# Patient Record
Sex: Male | Born: 1960
Health system: Southern US, Community
[De-identification: ages and names within clinical notes are randomized; demographics above are authoritative.]

## PROBLEM LIST (undated history)

## (undated) DIAGNOSIS — K219 Gastro-esophageal reflux disease without esophagitis: Secondary | ICD-10-CM

## (undated) HISTORY — PX: HERNIA REPAIR: SHX51

---

## 2001-08-17 ENCOUNTER — Encounter: Payer: Self-pay | Admitting: Emergency Medicine

## 2001-08-17 ENCOUNTER — Emergency Department (HOSPITAL_COMMUNITY): Admission: EM | Admit: 2001-08-17 | Discharge: 2001-08-17 | Payer: Self-pay | Admitting: Emergency Medicine

## 2002-04-30 ENCOUNTER — Encounter: Payer: Self-pay | Admitting: Emergency Medicine

## 2002-04-30 ENCOUNTER — Emergency Department (HOSPITAL_COMMUNITY): Admission: EM | Admit: 2002-04-30 | Discharge: 2002-04-30 | Payer: Self-pay | Admitting: Emergency Medicine

## 2003-07-13 ENCOUNTER — Ambulatory Visit (HOSPITAL_COMMUNITY): Admission: RE | Admit: 2003-07-13 | Discharge: 2003-07-13 | Payer: Self-pay | Admitting: Emergency Medicine

## 2004-07-03 ENCOUNTER — Emergency Department (HOSPITAL_COMMUNITY): Admission: EM | Admit: 2004-07-03 | Discharge: 2004-07-03 | Payer: Self-pay | Admitting: Emergency Medicine

## 2010-02-23 ENCOUNTER — Encounter
Admission: RE | Admit: 2010-02-23 | Discharge: 2010-03-01 | Payer: Self-pay | Source: Home / Self Care | Attending: Family Medicine | Admitting: Family Medicine

## 2010-03-07 ENCOUNTER — Ambulatory Visit: Payer: 59 | Admitting: Physical Therapy

## 2015-03-11 DIAGNOSIS — H5213 Myopia, bilateral: Secondary | ICD-10-CM | POA: Diagnosis not present

## 2016-03-10 DIAGNOSIS — H5213 Myopia, bilateral: Secondary | ICD-10-CM | POA: Diagnosis not present

## 2017-03-10 DIAGNOSIS — H5213 Myopia, bilateral: Secondary | ICD-10-CM | POA: Diagnosis not present

## 2017-06-26 DIAGNOSIS — S61214A Laceration without foreign body of right ring finger without damage to nail, initial encounter: Secondary | ICD-10-CM | POA: Diagnosis not present

## 2017-06-26 MED FILL — SULFAMETHOXAZOLE-TMP DS TAB: 800-160 | 10 days supply | Qty: 20 | Fill #0

## 2018-03-07 DIAGNOSIS — H5213 Myopia, bilateral: Secondary | ICD-10-CM | POA: Diagnosis not present

## 2019-04-11 ENCOUNTER — Ambulatory Visit (INDEPENDENT_AMBULATORY_CARE_PROVIDER_SITE_OTHER): Payer: 59 | Admitting: Orthopaedic Surgery

## 2019-04-11 ENCOUNTER — Ambulatory Visit (INDEPENDENT_AMBULATORY_CARE_PROVIDER_SITE_OTHER): Payer: 59

## 2019-04-11 ENCOUNTER — Other Ambulatory Visit: Payer: Self-pay

## 2019-04-11 VITALS — Ht 70.0 in | Wt 235.0 lb

## 2019-04-11 DIAGNOSIS — M25562 Pain in left knee: Secondary | ICD-10-CM

## 2019-04-11 DIAGNOSIS — G8929 Other chronic pain: Secondary | ICD-10-CM | POA: Diagnosis not present

## 2019-04-11 MED ORDER — LIDOCAINE HCL 1 % IJ SOLN
2.0000 mL | INTRAMUSCULAR | Status: AC | PRN
  Administered 2019-04-11: 2 mL

## 2019-04-11 MED ORDER — METHYLPREDNISOLONE ACETATE 40 MG/ML IJ SUSP
40.0000 mg | INTRAMUSCULAR | Status: AC | PRN
  Administered 2019-04-11: 40 mg via INTRA_ARTICULAR

## 2019-04-11 MED ORDER — BUPIVACAINE HCL 0.5 % IJ SOLN
2.0000 mL | INTRAMUSCULAR | Status: AC | PRN
  Administered 2019-04-11: 2 mL via INTRA_ARTICULAR

## 2019-04-11 NOTE — Progress Notes (Signed)
Office Visit Note   Patient: James George           Date of Birth: 03-27-1960           MRN: [id number] Visit Date: 04/11/2019              Requested by: No referring provider defined for this encounter. PCP: [facility]   Assessment & Plan: Visit Diagnoses:  1. Chronic pain of left knee     Plan: Impression is acute left medial meniscal tear with effusion.  He does not have any mechanical symptoms.  We agreed to proceed with aspiration and cortisone injection today which he tolerated well.  Patient instructed to follow-up with [place] if he does not receive any pain relief from this.  Neck step would be to obtain MRI.  Follow-Up Instructions: Return if symptoms worsen or fail to improve.   Orders:  Orders Placed This Encounter  Procedures  . XR KNEE 3 VIEW LEFT   No orders of the defined types were placed in this encounter.     Procedures: Large Joint Inj: L knee on 04/11/2019 10:54 AM Details: 22 G needle Medications: 2 mL bupivacaine 0.5 %; 2 mL lidocaine 1 %; 40 mg methylPREDNISolone acetate 40 MG/ML Aspirate: 10 mL clear Outcome: tolerated well, no immediate complications Patient was prepped and draped in the usual sterile fashion.       Clinical Data: No additional findings.   Subjective: Chief Complaint  Patient presents with  . Left Knee - Pain    James George is a very pleasant 59 year old gentleman who works as a Engineer, civil (consulting) in the [employer].  He has had chronic left knee pain for a year that has severely gotten worse in the last 2 weeks.  It hurts constantly on the medial side.  Denies any injuries.  Denies any mechanical symptoms.  He does feel some grinding occasionally.  He has pain with terminal flexion.   Review of Systems  Constitutional: Negative.   All other systems reviewed and are negative.    Objective: Vital Signs: Ht 5\' 10"  (1.778 m)   Wt 235 lb (106.6 kg)   BMI 33.72 kg/m   Physical Exam Vitals and nursing note  reviewed.  Constitutional:      Appearance: He is well-developed.  HENT:     Head: Normocephalic and atraumatic.  Eyes:     Pupils: Pupils are equal, round, and reactive to light.  Pulmonary:     Effort: Pulmonary effort is normal.  Abdominal:     Palpations: Abdomen is soft.  Musculoskeletal:        General: Normal range of motion.     Cervical back: Neck supple.  Skin:    General: Skin is warm.  Neurological:     Mental Status: He is alert and oriented to person, place, and time.  Psychiatric:        Behavior: Behavior normal.        Thought Content: Thought content normal.        Judgment: Judgment normal.     Ortho Exam Left knee exam shows a small joint effusion.  Medial joint line tenderness.  Positive McMurray.  Moderate pain with deep flexion.  Range of motion is slightly restricted secondary to the effusion and pain. Specialty Comments:  No specialty comments available.  Imaging: XR KNEE 3 VIEW LEFT  Result Date: 04/11/2019 No acute or structural abnormalities    PMFS History: There are no problems  to display for this patient.  No past medical history on file.  No family history on file.   Social History   Occupational History  . Not on file  Tobacco Use  . Smoking status: Not on file  Substance and Sexual Activity  . Alcohol use: Not on file  . Drug use: Not on file  . Sexual activity: Not on file

## 2019-04-21 ENCOUNTER — Encounter: Payer: Self-pay | Admitting: Orthopaedic Surgery

## 2019-04-21 ENCOUNTER — Other Ambulatory Visit: Payer: Self-pay

## 2019-04-21 DIAGNOSIS — M25562 Pain in left knee: Secondary | ICD-10-CM

## 2019-04-21 DIAGNOSIS — G8929 Other chronic pain: Secondary | ICD-10-CM

## 2019-04-21 NOTE — Telephone Encounter (Signed)
Let's get an MRI of left knee r/o MMT.

## 2019-04-22 ENCOUNTER — Telehealth: Payer: Self-pay | Admitting: *Deleted

## 2019-04-22 NOTE — Telephone Encounter (Signed)
See message below. Okay for note.   

## 2019-04-22 NOTE — Telephone Encounter (Signed)
Pt says he was put of work until Saturday and states he doesn't think he can go back to work until he has his MRI, pt would like to get another work note.

## 2019-04-22 NOTE — Telephone Encounter (Signed)
Yes that's fine 

## 2019-04-24 ENCOUNTER — Telehealth (HOSPITAL_COMMUNITY): Payer: Self-pay | Admitting: Orthopaedic Surgery

## 2019-04-24 ENCOUNTER — Other Ambulatory Visit: Payer: Self-pay | Admitting: Orthopaedic Surgery

## 2019-04-24 DIAGNOSIS — M25562 Pain in left knee: Secondary | ICD-10-CM

## 2019-04-24 DIAGNOSIS — G8929 Other chronic pain: Secondary | ICD-10-CM

## 2019-04-24 NOTE — Telephone Encounter (Signed)
Ready for pick up at the front desk. Called patient. He is aware.

## 2019-04-24 NOTE — Telephone Encounter (Signed)
04/24/19~S/W patient on mobile phone, patient wants to check cost of MRI @ [facility] vs [facility]. Patient was given [facility] and [facility] tel #. MF

## 2019-05-05 ENCOUNTER — Other Ambulatory Visit: Payer: Self-pay

## 2019-05-05 ENCOUNTER — Ambulatory Visit (HOSPITAL_COMMUNITY)
Admission: RE | Admit: 2019-05-05 | Discharge: 2019-05-05 | Disposition: A | Discharge status: Home / Self Care | Payer: 59 | Source: Ambulatory Visit | Attending: Orthopaedic Surgery | Admitting: Orthopaedic Surgery

## 2019-05-05 DIAGNOSIS — G8929 Other chronic pain: Secondary | ICD-10-CM | POA: Diagnosis not present

## 2019-05-05 DIAGNOSIS — M25562 Pain in left knee: Secondary | ICD-10-CM | POA: Diagnosis not present

## 2019-05-06 ENCOUNTER — Telehealth: Payer: Self-pay | Admitting: Orthopaedic Surgery

## 2019-05-06 ENCOUNTER — Encounter: Payer: Self-pay | Admitting: Orthopaedic Surgery

## 2019-05-06 NOTE — Telephone Encounter (Signed)
EMAILED NOTE

## 2019-05-06 NOTE — Telephone Encounter (Signed)
yes

## 2019-05-06 NOTE — Telephone Encounter (Signed)
Note made. See other message.

## 2019-05-06 NOTE — Telephone Encounter (Signed)
Patient called advised he need his out of work note extended until his appointment 05/13/2019 with Dr. Roda Shutters. Patient advised he think that he will be scheduled for surgery and will also need another out of work note at that time.  Patient asked if the note can be emailed to him? E-mail address   [email or web address].  The number to contact patient is [phone]

## 2019-05-06 NOTE — Telephone Encounter (Signed)
IC patient to schedule his MRI Review appointment with Dr. Roda Shutters.  Patient is scheduled for Tuesday, April 13.  Patient stated that he has an out of work note through yesterday.  He is needing an updated note to cover him through next Tuesday when he gets his results.  Please call when note is ready for p/u.  CB#[phone].  Thank you.

## 2019-05-12 ENCOUNTER — Encounter: Payer: Self-pay | Admitting: Orthopaedic Surgery

## 2019-05-12 NOTE — Telephone Encounter (Signed)
Yes that's fine.  Whatever he needs.

## 2019-05-13 ENCOUNTER — Ambulatory Visit (INDEPENDENT_AMBULATORY_CARE_PROVIDER_SITE_OTHER): Payer: 59 | Admitting: Orthopaedic Surgery

## 2019-05-13 ENCOUNTER — Other Ambulatory Visit: Payer: Self-pay

## 2019-05-13 ENCOUNTER — Encounter: Payer: Self-pay | Admitting: Orthopaedic Surgery

## 2019-05-13 DIAGNOSIS — S83282A Other tear of lateral meniscus, current injury, left knee, initial encounter: Secondary | ICD-10-CM | POA: Insufficient documentation

## 2019-05-13 DIAGNOSIS — S83242A Other tear of medial meniscus, current injury, left knee, initial encounter: Secondary | ICD-10-CM | POA: Insufficient documentation

## 2019-05-13 NOTE — Progress Notes (Signed)
   Office Visit Note   Patient: James George           Date of Birth: [birth year]           MRN: [id number] Visit Date: 05/13/2019              Requested by: [facility] [address] [place],  [place] [address] PCP: [facility]   Assessment & Plan: Visit Diagnoses:  1. Acute medial meniscus tear of left knee, initial encounter   2. Acute lateral meniscus tear of left knee, initial encounter     Plan: MRI shows a fairly extensive medial meniscal tear with a possible horizontal tear of the lateral meniscus and a small parameniscal cyst.  He has mild chondromalacia.  These findings were reviewed with the patient in detail and recommendation is for a partial medial meniscectomy and possible partial lateral meniscectomy and decompression of the para meniscal cyst.  Risk benefits rehab recovery reviewed with the patient in detail.  All questions answered.  We look forward to treating James George in the operating room in the near future.  Follow-Up Instructions: Return for 1 week postop visit.   Orders:  No orders of the defined types were placed in this encounter.  No orders of the defined types were placed in this encounter.     Procedures: No procedures performed   Clinical Data: No additional findings.   Subjective: Chief Complaint  Patient presents with  . Left Knee - Pain    James George returns today for MRI review of his left knee.  He continues to have significant pain of the left knee especially on the medial side.  He does use a knee scooter to help with ambulating long distances.   Review of Systems  Constitutional: Negative.   All other systems reviewed and are negative.    Objective: Vital Signs: There were no vitals taken for this visit.  Physical Exam Vitals and nursing note reviewed.  Constitutional:      Appearance: He is well-developed.  Pulmonary:     Effort: Pulmonary effort is normal.    Abdominal:     Palpations: Abdomen is soft.  Skin:    General: Skin is warm.  Neurological:     Mental Status: He is alert and oriented to person, place, and time.  Psychiatric:        Behavior: Behavior normal.        Thought Content: Thought content normal.        Judgment: Judgment normal.     Ortho Exam Left knee exam is unchanged. Specialty Comments:  No specialty comments available.  Imaging: No results found.   PMFS History: Patient Active Problem List   Diagnosis Date Noted  . Acute medial meniscus tear of left knee 05/13/2019  . Acute lateral meniscus tear of left knee 05/13/2019   History reviewed. No pertinent past medical history.  History reviewed. No pertinent family history.  History reviewed. No pertinent surgical history. Social History   Occupational History  . Not on file  Tobacco Use  . Smoking status: Never Smoker  . Smokeless tobacco: Never Used  Substance and Sexual Activity  . Alcohol use: Not on file  . Drug use: Not on file  . Sexual activity: Not on file

## 2019-05-14 NOTE — Telephone Encounter (Signed)
2-6 weeks

## 2019-05-16 NOTE — Telephone Encounter (Signed)
Yes he can be out of work

## 2019-05-19 ENCOUNTER — Other Ambulatory Visit: Payer: Self-pay

## 2019-05-20 ENCOUNTER — Encounter (HOSPITAL_BASED_OUTPATIENT_CLINIC_OR_DEPARTMENT_OTHER): Payer: Self-pay | Admitting: Orthopaedic Surgery

## 2019-05-20 ENCOUNTER — Other Ambulatory Visit: Payer: Self-pay

## 2019-05-24 ENCOUNTER — Other Ambulatory Visit (HOSPITAL_COMMUNITY)
Admission: RE | Admit: 2019-05-24 | Discharge: 2019-05-24 | Disposition: A | Discharge status: Home / Self Care | Payer: 59 | Source: Ambulatory Visit | Attending: Orthopaedic Surgery | Admitting: Orthopaedic Surgery

## 2019-05-24 DIAGNOSIS — Z01812 Encounter for preprocedural laboratory examination: Secondary | ICD-10-CM | POA: Insufficient documentation

## 2019-05-24 DIAGNOSIS — Z20822 Contact with and (suspected) exposure to covid-19: Secondary | ICD-10-CM | POA: Insufficient documentation

## 2019-05-25 LAB — SARS CORONAVIRUS 2 (TAT 6-24 HRS): SARS Coronavirus 2: NEGATIVE

## 2019-05-28 ENCOUNTER — Ambulatory Visit (HOSPITAL_BASED_OUTPATIENT_CLINIC_OR_DEPARTMENT_OTHER): Payer: 59 | Admitting: Anesthesiology

## 2019-05-28 ENCOUNTER — Ambulatory Visit (HOSPITAL_BASED_OUTPATIENT_CLINIC_OR_DEPARTMENT_OTHER)
Admission: RE | Admit: 2019-05-28 | Discharge: 2019-05-28 | Disposition: A | Discharge status: Home / Self Care | Payer: 59 | Attending: Orthopaedic Surgery | Admitting: Orthopaedic Surgery

## 2019-05-28 ENCOUNTER — Other Ambulatory Visit: Payer: Self-pay

## 2019-05-28 ENCOUNTER — Encounter (HOSPITAL_BASED_OUTPATIENT_CLINIC_OR_DEPARTMENT_OTHER): Payer: Self-pay | Admitting: Orthopaedic Surgery

## 2019-05-28 ENCOUNTER — Encounter (HOSPITAL_BASED_OUTPATIENT_CLINIC_OR_DEPARTMENT_OTHER)
Admission: RE | Disposition: A | Discharge status: Home / Self Care | Payer: Self-pay | Source: Home / Self Care | Attending: Orthopaedic Surgery

## 2019-05-28 DIAGNOSIS — X58XXXA Exposure to other specified factors, initial encounter: Secondary | ICD-10-CM | POA: Diagnosis not present

## 2019-05-28 DIAGNOSIS — E669 Obesity, unspecified: Secondary | ICD-10-CM | POA: Diagnosis not present

## 2019-05-28 DIAGNOSIS — M94261 Chondromalacia, right knee: Secondary | ICD-10-CM | POA: Diagnosis not present

## 2019-05-28 DIAGNOSIS — S83231A Complex tear of medial meniscus, current injury, right knee, initial encounter: Secondary | ICD-10-CM | POA: Diagnosis not present

## 2019-05-28 DIAGNOSIS — M2241 Chondromalacia patellae, right knee: Secondary | ICD-10-CM | POA: Insufficient documentation

## 2019-05-28 DIAGNOSIS — S83282A Other tear of lateral meniscus, current injury, left knee, initial encounter: Secondary | ICD-10-CM | POA: Diagnosis present

## 2019-05-28 DIAGNOSIS — S83242A Other tear of medial meniscus, current injury, left knee, initial encounter: Secondary | ICD-10-CM | POA: Diagnosis not present

## 2019-05-28 DIAGNOSIS — K219 Gastro-esophageal reflux disease without esophagitis: Secondary | ICD-10-CM | POA: Insufficient documentation

## 2019-05-28 DIAGNOSIS — Z6833 Body mass index (BMI) 33.0-33.9, adult: Secondary | ICD-10-CM | POA: Insufficient documentation

## 2019-05-28 HISTORY — PX: KNEE ARTHROSCOPY WITH MEDIAL MENISECTOMY: SHX5651

## 2019-05-28 HISTORY — DX: Gastro-esophageal reflux disease without esophagitis: K21.9

## 2019-05-28 SURGERY — ARTHROSCOPY, KNEE, WITH MEDIAL MENISCECTOMY
Anesthesia: General | Site: Knee | Laterality: Left

## 2019-05-28 MED ORDER — LIDOCAINE HCL (CARDIAC) PF 100 MG/5ML IV SOSY
PREFILLED_SYRINGE | INTRAVENOUS | Status: DC | PRN
  Administered 2019-05-28: 40 mg via INTRAVENOUS

## 2019-05-28 MED ORDER — OXYCODONE HCL 5 MG/5ML PO SOLN: 5.0000 mg | Freq: Once | ORAL | Status: DC | PRN

## 2019-05-28 MED ORDER — LACTATED RINGERS IV SOLN: INTRAVENOUS | Status: DC

## 2019-05-28 MED ORDER — MIDAZOLAM HCL 2 MG/2ML IJ SOLN
INTRAMUSCULAR | Status: AC
  Filled 2019-05-28: qty 2

## 2019-05-28 MED ORDER — PROPOFOL 10 MG/ML IV BOLUS
INTRAVENOUS | Status: DC | PRN
  Administered 2019-05-28: 180 mg via INTRAVENOUS

## 2019-05-28 MED ORDER — HYDROCODONE-ACETAMINOPHEN 5-325 MG PO TABS: 1.0000 | ORAL_TABLET | Freq: Three times a day (TID) | ORAL | 0 refills | Status: AC | PRN

## 2019-05-28 MED ORDER — DEXAMETHASONE SODIUM PHOSPHATE 10 MG/ML IJ SOLN
INTRAMUSCULAR | Status: AC
  Filled 2019-05-28: qty 1

## 2019-05-28 MED ORDER — MIDAZOLAM HCL 2 MG/2ML IJ SOLN
INTRAMUSCULAR | Status: DC | PRN
  Administered 2019-05-28: 2 mg via INTRAVENOUS

## 2019-05-28 MED ORDER — FENTANYL CITRATE (PF) 100 MCG/2ML IJ SOLN: 50.0000 ug | INTRAMUSCULAR | Status: DC | PRN

## 2019-05-28 MED ORDER — MEPERIDINE HCL 25 MG/ML IJ SOLN: 6.2500 mg | INTRAMUSCULAR | Status: DC | PRN

## 2019-05-28 MED ORDER — ONDANSETRON HCL 4 MG/2ML IJ SOLN
INTRAMUSCULAR | Status: DC | PRN
  Administered 2019-05-28: 4 mg via INTRAVENOUS

## 2019-05-28 MED ORDER — CEFAZOLIN SODIUM-DEXTROSE 2-4 GM/100ML-% IV SOLN
INTRAVENOUS | Status: AC
  Filled 2019-05-28: qty 100

## 2019-05-28 MED ORDER — FENTANYL CITRATE (PF) 100 MCG/2ML IJ SOLN: 25.0000 ug | INTRAMUSCULAR | Status: DC | PRN

## 2019-05-28 MED ORDER — CEFAZOLIN SODIUM-DEXTROSE 2-4 GM/100ML-% IV SOLN
2.0000 g | INTRAVENOUS | Status: AC
  Administered 2019-05-28: 2 g via INTRAVENOUS

## 2019-05-28 MED ORDER — SODIUM CHLORIDE 0.9 % IR SOLN
Status: DC | PRN
  Administered 2019-05-28: 3000 mL

## 2019-05-28 MED ORDER — ONDANSETRON HCL 4 MG/2ML IJ SOLN: 4.0000 mg | Freq: Once | INTRAMUSCULAR | Status: DC | PRN

## 2019-05-28 MED ORDER — FENTANYL CITRATE (PF) 100 MCG/2ML IJ SOLN
INTRAMUSCULAR | Status: AC
  Filled 2019-05-28: qty 2

## 2019-05-28 MED ORDER — DEXAMETHASONE SODIUM PHOSPHATE 10 MG/ML IJ SOLN
INTRAMUSCULAR | Status: DC | PRN
  Administered 2019-05-28: 5 mg via INTRAVENOUS

## 2019-05-28 MED ORDER — ONDANSETRON HCL 4 MG PO TABS: 4.0000 mg | ORAL_TABLET | Freq: Three times a day (TID) | ORAL | 0 refills | Status: AC | PRN

## 2019-05-28 MED ORDER — ONDANSETRON HCL 4 MG/2ML IJ SOLN
INTRAMUSCULAR | Status: AC
  Filled 2019-05-28: qty 2

## 2019-05-28 MED ORDER — FENTANYL CITRATE (PF) 100 MCG/2ML IJ SOLN
INTRAMUSCULAR | Status: DC | PRN
  Administered 2019-05-28 (×2): 25 ug via INTRAVENOUS
  Administered 2019-05-28: 50 ug via INTRAVENOUS
  Administered 2019-05-28: 25 ug via INTRAVENOUS

## 2019-05-28 MED ORDER — BUPIVACAINE HCL (PF) 0.25 % IJ SOLN
INTRAMUSCULAR | Status: DC | PRN
  Administered 2019-05-28: 20 mL via INTRA_ARTICULAR

## 2019-05-28 MED ORDER — OXYCODONE HCL 5 MG PO TABS: 5.0000 mg | ORAL_TABLET | Freq: Once | ORAL | Status: DC | PRN

## 2019-05-28 MED ORDER — LIDOCAINE 2% (20 MG/ML) 5 ML SYRINGE
INTRAMUSCULAR | Status: AC
  Filled 2019-05-28: qty 5

## 2019-05-28 MED ORDER — MIDAZOLAM HCL 2 MG/2ML IJ SOLN: 1.0000 mg | INTRAMUSCULAR | Status: DC | PRN

## 2019-05-28 MED FILL — ONDANSETRON HCL 4 MG TABS: 4 | 3 days supply | Qty: 20 | Fill #0

## 2019-05-28 MED FILL — HYDROCODON-APAP 5-325: 5-325 | 5 days supply | Qty: 30 | Fill #0

## 2019-05-28 SURGICAL SUPPLY — 38 items
BANDAGE ESMARK 6X9 LF (GAUZE/BANDAGES/DRESSINGS) IMPLANT
BLADE SHAVER TORPEDO 4X13 (MISCELLANEOUS) ×2 IMPLANT
BNDG CMPR 9X6 STRL LF SNTH (GAUZE/BANDAGES/DRESSINGS)
BNDG COHESIVE 6X5 TAN STRL LF (GAUZE/BANDAGES/DRESSINGS) ×2 IMPLANT
BNDG ELASTIC 6X5.8 VLCR STR LF (GAUZE/BANDAGES/DRESSINGS) ×6 IMPLANT
BNDG ESMARK 6X9 LF (GAUZE/BANDAGES/DRESSINGS)
COVER WAND RF STERILE (DRAPES) IMPLANT
CUFF TOURN SGL QUICK 34 (TOURNIQUET CUFF) ×3
CUFF TRNQT CYL 34X4.125X (TOURNIQUET CUFF) ×1 IMPLANT
DRAPE ARTHROSCOPY W/POUCH 90 (DRAPES) ×3 IMPLANT
DRAPE IMP U-DRAPE 54X76 (DRAPES) ×3 IMPLANT
DRAPE U-SHAPE 47X51 STRL (DRAPES) ×3 IMPLANT
DURAPREP 26ML APPLICATOR (WOUND CARE) ×3 IMPLANT
GAUZE SPONGE 4X4 12PLY STRL (GAUZE/BANDAGES/DRESSINGS) ×3 IMPLANT
GAUZE XEROFORM 1X8 LF (GAUZE/BANDAGES/DRESSINGS) ×3 IMPLANT
GLOVE BIO SURGEON STRL SZ7 (GLOVE) ×3 IMPLANT
GLOVE BIOGEL PI IND STRL 7.0 (GLOVE) ×1 IMPLANT
GLOVE BIOGEL PI INDICATOR 7.0 (GLOVE) ×8
GLOVE ECLIPSE 7.0 STRL STRAW (GLOVE) ×1 IMPLANT
GLOVE ECLIPSE 7.5 STRL STRAW (GLOVE) ×2 IMPLANT
GLOVE SKINSENSE NS SZ7.5 (GLOVE) ×2
GLOVE SKINSENSE STRL SZ7.5 (GLOVE) ×1 IMPLANT
GLOVE SURG SS PI 7.0 STRL IVOR (GLOVE) ×2 IMPLANT
GLOVE SURG SYN 7.5  E (GLOVE) ×3
GLOVE SURG SYN 7.5 E (GLOVE) ×1 IMPLANT
GLOVE SURG SYN 7.5 PF PI (GLOVE) ×1 IMPLANT
GOWN STRL REIN XL XLG (GOWN DISPOSABLE) ×3 IMPLANT
GOWN STRL REUS W/ TWL LRG LVL3 (GOWN DISPOSABLE) ×1 IMPLANT
GOWN STRL REUS W/ TWL XL LVL3 (GOWN DISPOSABLE) ×1 IMPLANT
GOWN STRL REUS W/TWL LRG LVL3 (GOWN DISPOSABLE) ×6
GOWN STRL REUS W/TWL XL LVL3 (GOWN DISPOSABLE) ×3
KNEE WRAP E Z 3 GEL PACK (MISCELLANEOUS) ×3 IMPLANT
MANIFOLD NEPTUNE II (INSTRUMENTS) ×3 IMPLANT
PACK ARTHROSCOPY DSU (CUSTOM PROCEDURE TRAY) ×3 IMPLANT
SET BASIN DAY SURGERY F.S. (CUSTOM PROCEDURE TRAY) ×3 IMPLANT
SUT ETHILON 3 0 PS 1 (SUTURE) ×3 IMPLANT
TOWEL GREEN STERILE FF (TOWEL DISPOSABLE) ×3 IMPLANT
TUBING ARTHROSCOPY IRRIG 16FT (MISCELLANEOUS) ×3 IMPLANT

## 2019-05-28 NOTE — Discharge Instructions (Signed)
  Post-operative patient instructions  Knee Arthroscopy   . Ice:  Place intermittent ice or cooler pack over your knee, 30 minutes on and 30 minutes off.  Continue this for the first 72 hours after surgery, then save ice for use after therapy sessions or on more active days.   . Weight:  You may bear weight on your leg as your symptoms allow. . Crutches:  Use crutches (or walker) to assist in walking until told to discontinue by your physical therapist or physician. This will help to reduce pain. . Strengthening:  Perform simple thigh squeezes (isometric quad contractions) and straight leg lifts as you are able (3 sets of 5 to 10 repetitions, 3 times a day).  For the leg lifts, have someone support under your ankle in the beginning until you have increased strength enough to do this on your own.  To help get started on thigh squeezes, place a pillow under your knee and push down on the pillow with back of knee (sometimes easier to do than with your leg fully straight). . Motion:  Perform gentle knee motion as tolerated - this is gentle bending and straightening of the knee. Seated heel slides: you can start by sitting in a chair, remove your brace, and gently slide your heel back on the floor - allowing your knee to bend. Have someone help you straighten your knee (or use your other leg/foot hooked under your ankle.  . Dressing:  Perform 1st dressing change at 2 days postoperative. A moderate amount of blood tinged drainage is to be expected.  So if you bleed through the dressing on the first or second day or if you have fevers, it is fine to change the dressing/check the wounds early and redress wound. Elevate your leg.  If it bleeds through again, or if the incisions are leaking frank blood, please call the office. May change dressing every 1-2 days thereafter to help watch wounds. Can purchase Tegaderm (or 3M Nexcare) water resistant dressings at local pharmacy / [facility]. . Shower:  Light shower is  ok after 2 days.  Please take shower, NO bath. Recover with gauze and ace wrap to help keep wounds protected.   . Pain medication:  A narcotic pain medication has been prescribed.  Take as directed.  Typically you need narcotic pain medication more regularly during the first 3 to 5 days after surgery.  Decrease your use of the medication as the pain improves.  Narcotics can sometimes cause constipation, even after a few doses.  If you have problems with constipation, you can take an over the counter stool softener or light laxative.  If you have persistent problems, please notify your physician's office. . Physical therapy: Additional activity guidelines to be provided by your physician or physical therapist at follow-up visits.  . Driving: Do not recommend driving x 2 weeks post surgical, especially if surgery performed on right side. Should not drive while taking narcotic pain medications. It typically takes at least 2 weeks to restore sufficient neuromuscular function for normal reaction times for driving safety.  . Call [phone] for questions or problems. Evenings you will be forwarded to the hospital operator.  Ask for the orthopaedic physician on call. Please call if you experience:    o Redness, foul smelling, or persistent drainage from the surgical site  o worsening knee pain and swelling not responsive to medication  o any calf pain and or swelling of the lower leg  o temperatures greater than   101.5 F o other questions or concerns   Thank you for allowing [place] to be a part of your care.   Post Anesthesia Home Care Instructions  Activity: Get plenty of rest for the remainder of the day. A responsible individual must stay with you for 24 hours following the procedure.  For the next 24 hours, DO NOT: -Drive a car -Advertising copywriter -Drink alcoholic beverages -Take any medication unless instructed by your physician -Make any legal decisions or sign important papers.  Meals: Start  with liquid foods such as gelatin or soup. Progress to regular foods as tolerated. Avoid greasy, spicy, heavy foods. If nausea and/or vomiting occur, drink only clear liquids until the nausea and/or vomiting subsides. Call your physician if vomiting continues.  Special Instructions/Symptoms: Your throat may feel dry or sore from the anesthesia or the breathing tube placed in your throat during surgery. If this causes discomfort, gargle with warm salt water. The discomfort should disappear within 24 hours.  Call your surgeon if you experience:   1.  Fever over 101.0. 2.  Inability to urinate. 3.  Nausea and/or vomiting. 4.  Extreme swelling or bruising at the surgical site. 5.  Continued bleeding from the incision. 6.  Increased pain, redness or drainage from the incision. 7.  Problems related to your pain medication. 8.  Any problems and/or concerns

## 2019-05-28 NOTE — Op Note (Signed)
   Surgery Date: 05/28/2019  PREOPERATIVE DIAGNOSES:  1. Right knee medial meniscus tear  POSTOPERATIVE DIAGNOSES:  same  PROCEDURES PERFORMED:  1. Right knee arthroscopy with limited synovectomy and diagnostic arthroscopy 2. Right knee arthroscopy with arthroscopic partial medial meniscectomy  SURGEON: N. Glee Arvin, M.D.  ASSIST: Starlyn Skeans [place], [place]; necessary for the timely completion of procedure and due to complexity of procedure.  ANESTHESIA:  general  FLUIDS: Per anesthesia record.   ESTIMATED BLOOD LOSS: minimal  DESCRIPTION OF PROCEDURE: James George is a 59 y.o.-year-old male with right knee medial meniscus tear. Plans are to proceed with partial medial meniscectomy and diagnostic arthroscopy with debridement as indicated. Full discussion held regarding risks benefits alternatives and complications related surgical intervention. Conservative care options reviewed. All questions answered.  The patient was identified in the preoperative holding area and the operative extremity was marked. The patient was brought to the operating room and transferred to operating table in a supine position. Satisfactory general anesthesia was induced by anesthesiology.    Standard anterolateral, anteromedial arthroscopy portals were obtained. The anteromedial portal was obtained with a spinal needle for localization under direct visualization with subsequent diagnostic findings.   Incisions were made for arthroscopic portals.  Diagnostic arthroscopy and limited synovectomy was performed for visualization.  We then position the arthroscope in the medial compartment and placed the knee in valgus.  He had mild grade I chondromalacia of the medial compartment.  I identified the complex tear of the medial meniscus mainly in the posterior horn with some extension into the mid body.  Partial medial meniscectomy was performed using meniscus basket and oscillating shaver back to stable border.   Approximately 70% of the meniscus was remaining.  The cruciates were unremarkable.  The knee was then placed in a figure 4 position for the lateral compartment which was unremarkable.  The lateral meniscus did not demonstrate any tears.  He had no significant chondromalacia of the lateral compartment.  The knee was then brought into full extension.  Grade I-II chondromalacia of the femoral trochlea and patellar surface.  Gutters were checked for loose bodies.  Excess fluid was removed from the knee joint.  Incisions were closed with interrupted nylon sutures.  Sterile dressings were applied.  Patient tolerated procedure well had no immediate complications.  Suprapatellar pouch and gutters: mild synovitis or debris. Patella chondral surface: Grade 2 Trochlear chondral surface: Grade 1 Patellofemoral tracking: normal Medial meniscus: delaminating tear of posterior horn.  Medial femoral condyle weight bearing surface: Grade 1 Medial tibial plateau: Grade 1 Anterior cruciate ligament:stable Posterior cruciate ligament:stable Lateral meniscus: normal.   Lateral femoral condyle weight bearing surface: Grade 0 Lateral tibial plateau: Grade 0  DISPOSITION: The patient was awakened from general anesthetic, extubated, taken to the recovery room in medically stable condition, no apparent complications. The patient may be weightbearing as tolerated to the operative lower extremity.  Range of motion of right knee as tolerated.  Mayra Reel, MD [facility] 9:23 AM

## 2019-05-28 NOTE — Anesthesia Procedure Notes (Signed)
Procedure Name: LMA Insertion Date/Time: 05/28/2019 8:47 AM Performed by: Yolonda Kida, CRNA Pre-anesthesia Checklist: Patient identified, Emergency Drugs available, Suction available and Patient being monitored Patient Re-evaluated:Patient Re-evaluated prior to induction Oxygen Delivery Method: Circle system utilized Preoxygenation: Pre-oxygenation with 100% oxygen Induction Type: IV induction LMA: LMA inserted LMA Size: 4.0 Number of attempts: 1 Placement Confirmation: positive ETCO2 and breath sounds checked- equal and bilateral Tube secured with: Tape Dental Injury: Teeth and Oropharynx as per pre-operative assessment

## 2019-05-28 NOTE — Transfer of Care (Signed)
Immediate Anesthesia Transfer of Care Note  Patient: James George  Procedure(s) Performed: LEFT KNEE ARTHROSCOPY WITH PARTIAL MEDIAL MENISCECTOMY (Left Knee)  Patient Location: PACU  Anesthesia Type:General  Level of Consciousness: sedated  Airway & Oxygen Therapy: Patient Spontanous Breathing and Patient connected to face mask oxygen  Post-op Assessment: Report given to RN and Post -op Vital signs reviewed and stable  Post vital signs: Reviewed and stable  Last Vitals:  Vitals Value Taken Time  BP    Temp    Pulse 62 05/28/19 0935  Resp 11 05/28/19 0935  SpO2 100 % 05/28/19 0935  Vitals shown include unvalidated device data.  Last Pain:  Vitals:   05/28/19 0714  TempSrc: Oral  PainSc: 4       Patients Stated Pain Goal: 4 (05/28/19 0714)  Complications: No apparent anesthesia complications

## 2019-05-28 NOTE — H&P (Signed)
    PREOPERATIVE H&P  Chief Complaint: left knee medial meniscal tear, possible lateral meniscal tear  HPI: James George is a 59 y.o. male who presents for surgical treatment of left knee medial meniscal tear, possible lateral meniscal tear.  He denies any changes in medical history.  Past Medical History:  Diagnosis Date  . GERD (gastroesophageal reflux disease)    Past Surgical History:  Procedure Laterality Date  . HERNIA REPAIR     age 45   Social History   Socioeconomic History  . Marital status: Married    Spouse name: Not on file  . Number of children: Not on file  . Years of education: Not on file  . Highest education level: Not on file  Occupational History  . Not on file  Tobacco Use  . Smoking status: Never Smoker  . Smokeless tobacco: Never Used  Substance and Sexual Activity  . Alcohol use: Never  . Drug use: Never  . Sexual activity: Not on file  Other Topics Concern  . Not on file  Social History Narrative  . Not on file   Social Determinants of Health   Financial Resource Strain:   . Difficulty of Paying Living Expenses:   Food Insecurity:   . Worried About Programme researcher, broadcasting/film/video in the Last Year:   . Barista in the Last Year:   Transportation Needs:   . Freight forwarder (Medical):   Marland Kitchen Lack of Transportation (Non-Medical):   Physical Activity:   . Days of Exercise per Week:   . Minutes of Exercise per Session:   Stress:   . Feeling of Stress :   Social Connections:   . Frequency of Communication with Friends and Family:   . Frequency of Social Gatherings with Friends and Family:   . Attends Religious Services:   . Active Member of Clubs or Organizations:   . Attends Banker Meetings:   Marland Kitchen Marital Status:    History reviewed. No pertinent family history. No Known Allergies Prior to Admission medications   Not on File     Positive ROS: All other systems have been reviewed and were otherwise negative with the  exception of those mentioned in the HPI and as above.  Physical Exam: General: Alert, no acute distress Cardiovascular: No pedal edema Respiratory: No cyanosis, no use of accessory musculature GI: abdomen soft Skin: No lesions in the area of chief complaint Neurologic: Sensation intact distally Psychiatric: Patient is competent for consent with normal mood and affect Lymphatic: no lymphedema  MUSCULOSKELETAL: exam stable  Assessment: left knee medial meniscal tear, possible lateral meniscal tear  Plan: Plan for Procedure(s): LEFT KNEE ARTHROSCOPY WITH PARTIAL MEDIAL MENISCECTOMY, POSSIBLE PARTIAL LATERAL MENISCECTOMY  The risks benefits and alternatives were discussed with the patient including but not limited to the risks of nonoperative treatment, versus surgical intervention including infection, bleeding, nerve injury,  blood clots, cardiopulmonary complications, morbidity, mortality, among others, and they were willing to proceed.   Glee Arvin, MD   05/28/2019 7:24 AM

## 2019-05-28 NOTE — Anesthesia Preprocedure Evaluation (Addendum)
Anesthesia Evaluation  Patient identified by MRN, date of birth, ID band Patient awake    Reviewed: Allergy & Precautions, NPO status , Patient's Chart, lab work & pertinent test results  Airway Mallampati: II  TM Distance: >3 FB Neck ROM: Full    Dental no notable dental hx. (+) Teeth Intact, Caps   Pulmonary neg pulmonary ROS,    Pulmonary exam normal breath sounds clear to auscultation       Cardiovascular Normal cardiovascular exam Rhythm:Regular Rate:Normal     Neuro/Psych negative neurological ROS  negative psych ROS   GI/Hepatic Neg liver ROS, GERD  Medicated and Controlled,  Endo/Other  Obesity  Renal/GU negative Renal ROS  negative genitourinary   Musculoskeletal TMM left knee   Abdominal (+) + obese,   Peds  Hematology negative hematology ROS (+)   Anesthesia Other Findings   Reproductive/Obstetrics                             Anesthesia Physical Anesthesia Plan  ASA: II  Anesthesia Plan: General   Post-op Pain Management:    Induction: Intravenous  PONV Risk Score and Plan: 3 and Midazolam, Ondansetron, Dexamethasone and Treatment may vary due to age or medical condition  Airway Management Planned: LMA  Additional Equipment:   Intra-op Plan:   Post-operative Plan: Extubation in OR  Informed Consent: I have reviewed the patients History and Physical, chart, labs and discussed the procedure including the risks, benefits and alternatives for the proposed anesthesia with the patient or authorized representative who has indicated his/her understanding and acceptance.     Dental advisory given  Plan Discussed with: CRNA and Surgeon  Anesthesia Plan Comments:         Anesthesia Quick Evaluation

## 2019-05-28 NOTE — Anesthesia Postprocedure Evaluation (Signed)
Anesthesia Post Note  Patient: James George  Procedure(s) Performed: LEFT KNEE ARTHROSCOPY WITH PARTIAL MEDIAL MENISCECTOMY (Left Knee)     Patient location during evaluation: PACU Anesthesia Type: General Level of consciousness: awake and alert and oriented Pain management: pain level controlled Vital Signs Assessment: post-procedure vital signs reviewed and stable Respiratory status: spontaneous breathing, nonlabored ventilation and respiratory function stable Cardiovascular status: blood pressure returned to baseline and stable Postop Assessment: no apparent nausea or vomiting Anesthetic complications: no    Last Vitals:  Vitals:   05/28/19 1000 05/28/19 1025  BP: 137/84 135/83  Pulse: 63 62  Resp: 13 16  Temp:  36.9 C  SpO2: 98% 97%    Last Pain:  Vitals:   05/28/19 1025  TempSrc:   PainSc: 5                  Ardella Chhim A.

## 2019-05-29 ENCOUNTER — Encounter: Payer: Self-pay | Admitting: *Deleted

## 2019-06-04 ENCOUNTER — Other Ambulatory Visit: Payer: Self-pay

## 2019-06-04 ENCOUNTER — Encounter: Payer: Self-pay | Admitting: Orthopaedic Surgery

## 2019-06-04 ENCOUNTER — Ambulatory Visit (INDEPENDENT_AMBULATORY_CARE_PROVIDER_SITE_OTHER): Payer: 59 | Admitting: Physician Assistant

## 2019-06-04 DIAGNOSIS — Z9889 Other specified postprocedural states: Secondary | ICD-10-CM

## 2019-06-04 NOTE — Progress Notes (Signed)
   Post-Op Visit Note   Patient: James George           Date of Birth: 01/23/1961           MRN: [id number] Visit Date: 06/04/2019 PCP: [facility]   Assessment & Plan:  Chief Complaint:  Chief Complaint  Patient presents with  . Left Knee - Pain   Visit Diagnoses:  1. 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  He has been doing well.  No fevers or chills.  He is ambulating without assistance.  He has been taking naproxen for pain.  Examination of his left knee reveals a trace effusion.  Range of motion from 0 to 90 degrees.  He does have well-healing surgical portals without evidence of infection or cellulitis.  Nylon sutures are in place.  Calves are soft and nontender.  He is neurovascular intact distally.  Today, nylon sutures were removed and Steri-Strips applied.  He will advance with activity as tolerated.  He currently has a work note to last him for approximately 6 weeks postop, but he will call us if he needs this addended.  I have told him that we are happy to write him to return to work half days or whatever suits him best if needed.  He will follow-up with [place] in 5 weeks time for recheck, however if he is doing very well he can call and let us know and cancel his appointment.  Call with concerns or questions in the meantime.  Follow-Up Instructions: Return in about 5 weeks (around 07/09/2019).   Orders:  No orders of the defined types were placed in this encounter.  No orders of the defined types were placed in this encounter.   Imaging: No new imaging  PMFS History: Patient Active Problem List   Diagnosis Date Noted  . Acute medial meniscus tear of left knee 05/13/2019  . Acute lateral meniscus tear of left knee 05/13/2019   Past Medical History:  Diagnosis Date  . GERD  (gastroesophageal reflux disease)     History reviewed. No pertinent family history.  Past Surgical History:  Procedure Laterality Date  . HERNIA REPAIR     age 52  . KNEE ARTHROSCOPY WITH MEDIAL MENISECTOMY Left 05/28/2019   Procedure: LEFT KNEE ARTHROSCOPY WITH PARTIAL MEDIAL MENISCECTOMY;  Surgeon: Tarry Kos, MD;  Location: [facility];  Service: Orthopedics;  Laterality: Left;   Social History   Occupational History  . Not on file  Tobacco Use  . Smoking status: Never Smoker  . Smokeless tobacco: Never Used  Substance and Sexual Activity  . Alcohol use: Never  . Drug use: Never  . Sexual activity: Not on file

## 2019-07-09 ENCOUNTER — Ambulatory Visit (INDEPENDENT_AMBULATORY_CARE_PROVIDER_SITE_OTHER): Payer: 59 | Admitting: Orthopaedic Surgery

## 2019-07-09 ENCOUNTER — Other Ambulatory Visit: Payer: Self-pay

## 2019-07-09 ENCOUNTER — Encounter: Payer: Self-pay | Admitting: Orthopaedic Surgery

## 2019-07-09 ENCOUNTER — Telehealth: Payer: Self-pay

## 2019-07-09 DIAGNOSIS — S83242A Other tear of medial meniscus, current injury, left knee, initial encounter: Secondary | ICD-10-CM

## 2019-07-09 DIAGNOSIS — Z9889 Other specified postprocedural states: Secondary | ICD-10-CM | POA: Insufficient documentation

## 2019-07-09 NOTE — Progress Notes (Signed)
   Post-Op Visit Note   Patient: James George           Date of Birth: December 02, 1960           MRN: [id number] Visit Date: 07/09/2019 PCP: [facility]   Assessment & Plan:  Chief Complaint:  Chief Complaint  Patient presents with  . Left Knee - Pain, Follow-up, Routine Post Op   Visit Diagnoses:  1. S/P arthroscopy of left knee   2. Acute medial meniscus tear of left knee, initial encounter     Plan: Faaris is 6 weeks status post left knee arthroscopy with partial medial meniscectomy.  He is doing well overall.  Not taking any medications.  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  Surgical scars are fully healed.  No swelling or joint effusion.  Excellent range of motion.  Ambulating normally.  At this point he has recovered from his surgery well return back to work the Saturday.  We can see him back as needed.  Follow-Up Instructions: Return if symptoms worsen or fail to improve.   Orders:  No orders of the defined types were placed in this encounter.  No orders of the defined types were placed in this encounter.   Imaging: No results found.  PMFS History: Patient Active Problem List   Diagnosis Date Noted  . S/P arthroscopy of left knee 07/09/2019  . Acute medial meniscus tear of left knee 05/13/2019  . Acute lateral meniscus tear of left knee 05/13/2019   Past Medical History:  Diagnosis Date  . GERD (gastroesophageal reflux disease)     History reviewed. No pertinent family history.  Past Surgical History:  Procedure Laterality Date  . HERNIA REPAIR     age 25  . KNEE ARTHROSCOPY WITH MEDIAL MENISECTOMY Left 05/28/2019   Procedure: LEFT KNEE ARTHROSCOPY WITH PARTIAL MEDIAL MENISCECTOMY;  Surgeon: Tarry Kos, MD;  Location: [facility];  Service: Orthopedics;  Laterality: Left;   Social History   Occupational History  . Not on file  Tobacco Use  . Smoking status: Never Smoker    . Smokeless tobacco: Never Used  Substance and Sexual Activity  . Alcohol use: Never  . Drug use: Never  . Sexual activity: Not on file

## 2019-07-09 NOTE — Telephone Encounter (Signed)
Faxed work note to [employer] per Patients request.  Fax number  [phone]   Claim number [id number]

## 2020-05-30 DIAGNOSIS — H5213 Myopia, bilateral: Secondary | ICD-10-CM | POA: Diagnosis not present

## 2021-01-10 IMAGING — MR MR KNEE*L* W/O CM
6 series · 39 of 40 positions shown · non-contrast
Comparison: None.

CLINICAL DATA: Left knee pain for 1 year.

EXAM:
MRI OF THE LEFT KNEE WITHOUT CONTRAST
TECHNIQUE: Multiplanar, multisequence MR imaging of the knee was performed. No
intravenous contrast was administered.

[Series 5: T2 fat-sat · axial · left · 4.0mm · 0.53mm/px · z∈[-87,+54]mm · 8 of 33 slices shown (1 of 3)]
[im 1/33]
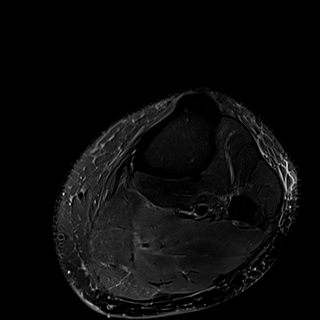
[im 5/33]
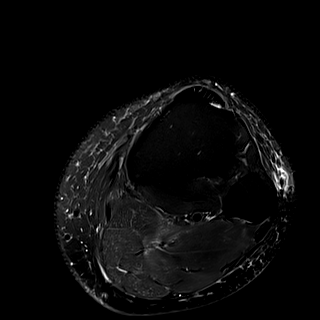
[im 10/33]
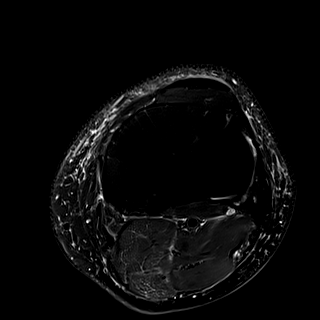
[im 14/33]
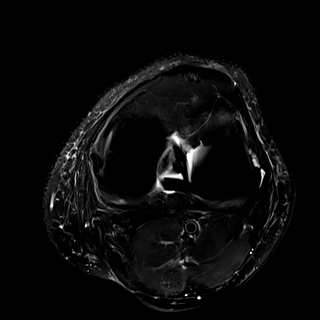
[im 19/33]
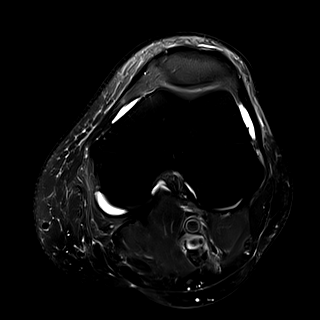
[im 23/33]
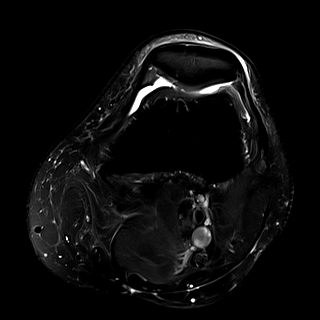
[im 28/33]
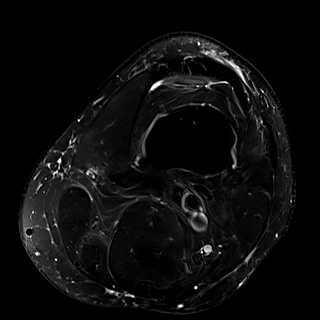
[im 33/33]
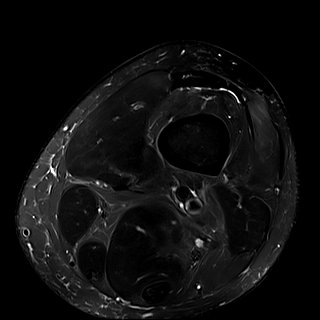

[Series 6: T1 · coronal · left · 4.0mm · 0.29mm/px · 5 of 25 slices shown]
[im 1/25]
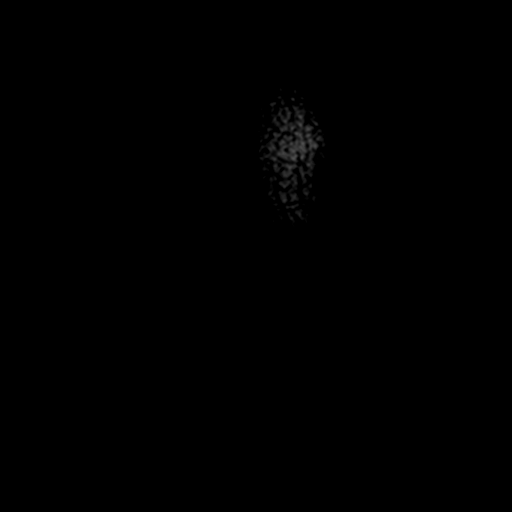
[im 5/25]
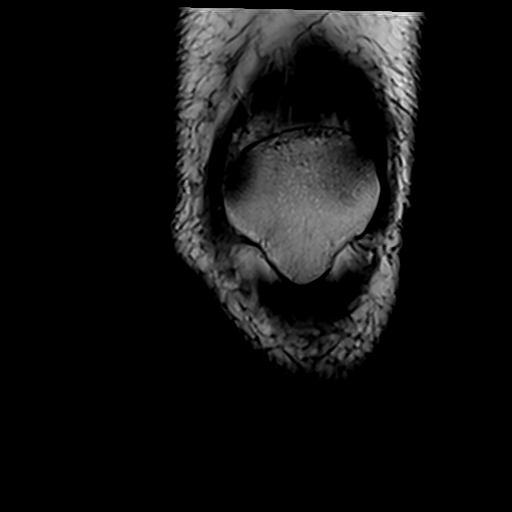
[im 10/25]
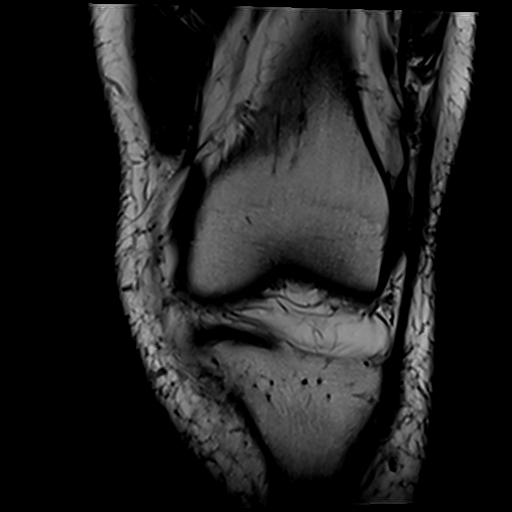
[im 15/25]
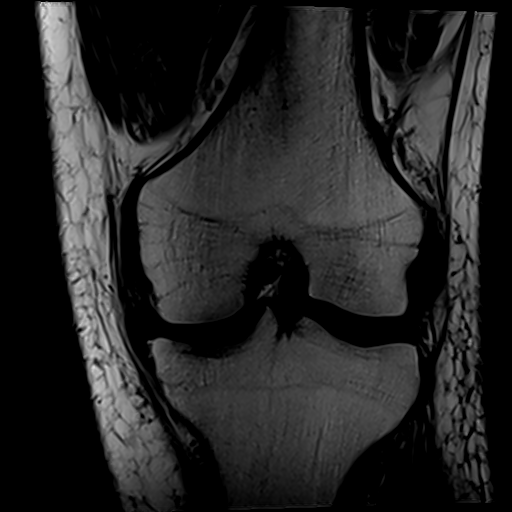
[im 20/25]
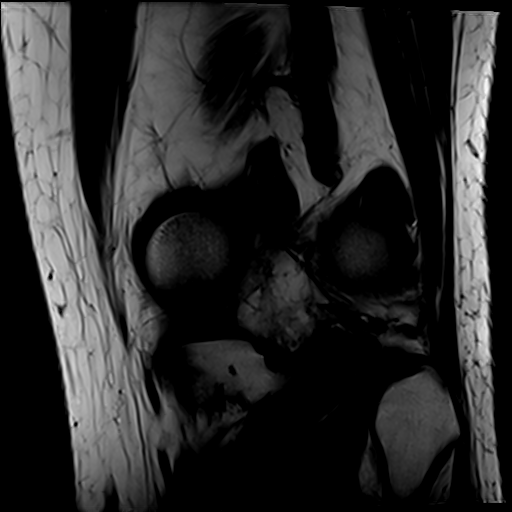

[Series 7: T2 fat-sat · coronal · left · 4.0mm · 0.59mm/px · 6 of 25 slices shown (2 of 3)]
[im 1/25]
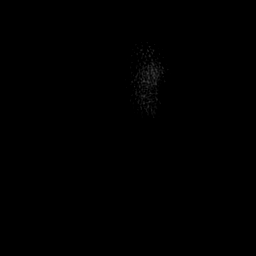
[im 5/25]
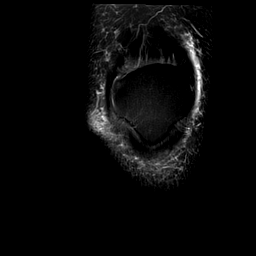
[im 10/25]
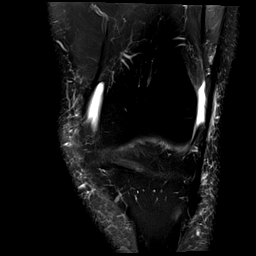
[im 15/25]
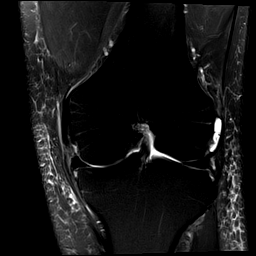
[im 20/25]
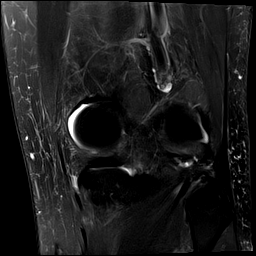
[im 25/25]
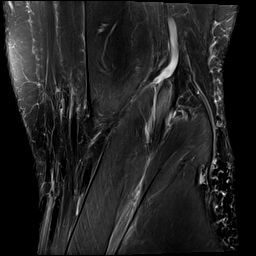

[Series 8: PD fat-sat · coronal · left · 3.0mm · 0.47mm/px · 8 of 30 slices shown (1 of 2)]
[im 1/30]
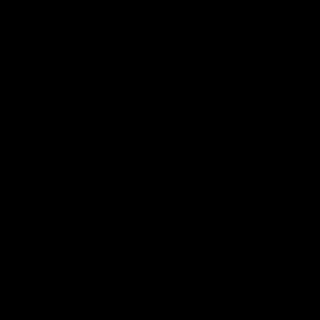
[im 5/30]
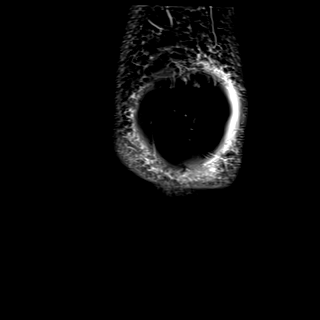
[im 9/30]
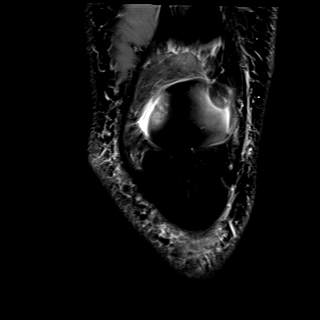
[im 13/30]
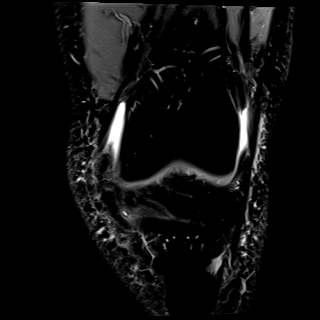
[im 17/30]
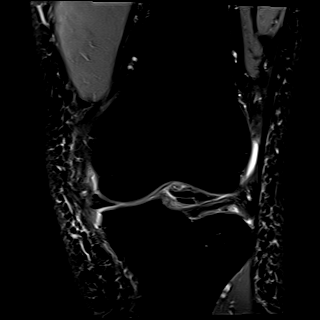
[im 21/30]
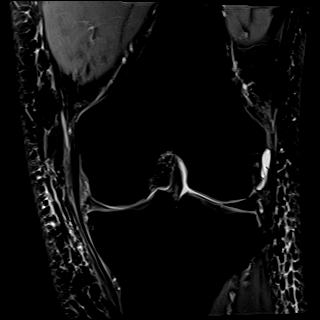
[im 25/30]
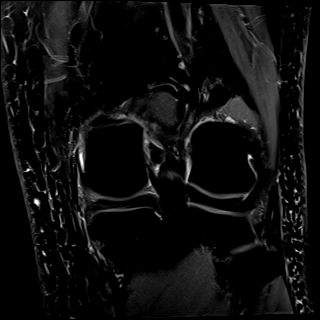
[im 30/30]
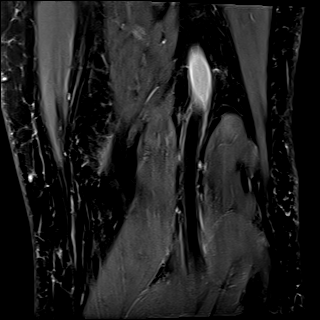

[Series 9: PD fat-sat · sagittal · left · 4.0mm · 0.50mm/px · 6 of 22 slices shown (2 of 2)]
[im 1/22]
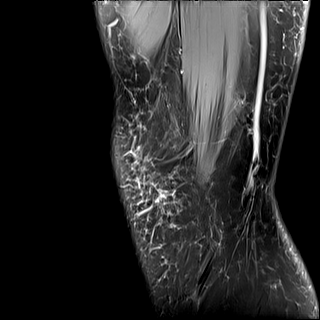
[im 5/22]
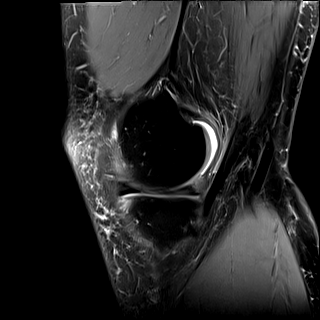
[im 9/22]
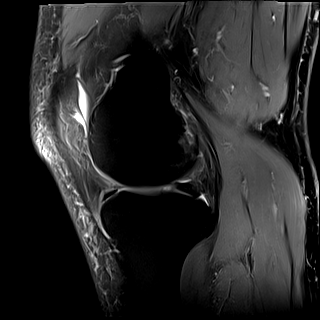
[im 13/22]
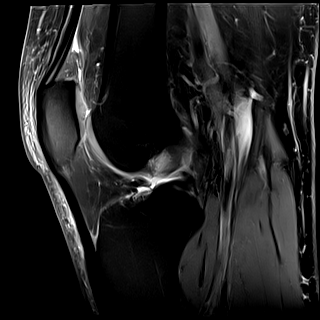
[im 17/22]
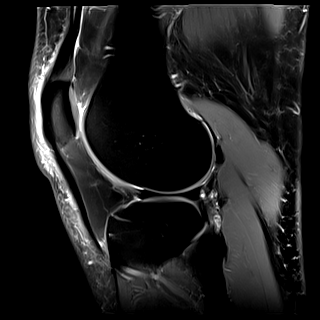
[im 22/22]
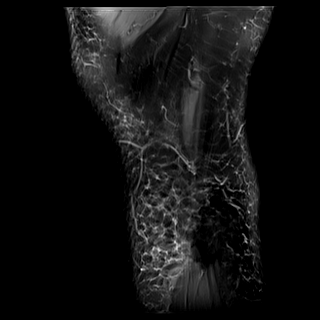

[Series 10: T2 fat-sat · sagittal · left · 4.0mm · 0.47mm/px · 6 of 22 slices shown (3 of 3)]
[im 1/22]
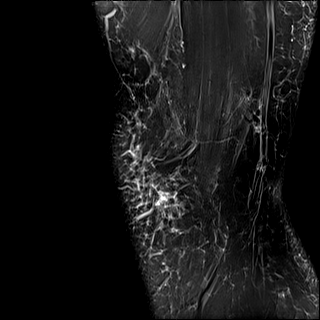
[im 5/22]
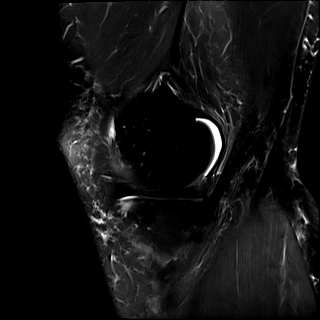
[im 9/22]
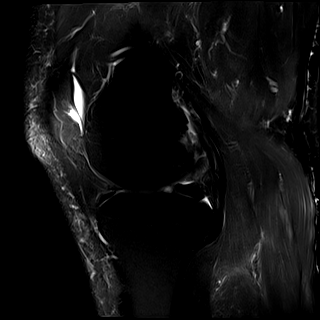
[im 13/22]
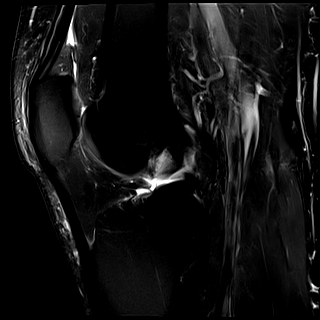
[im 17/22]
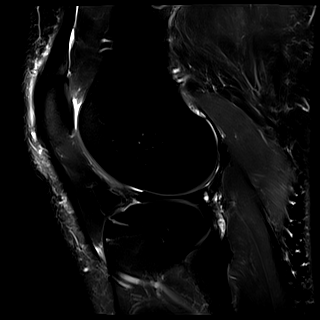
[im 22/22]
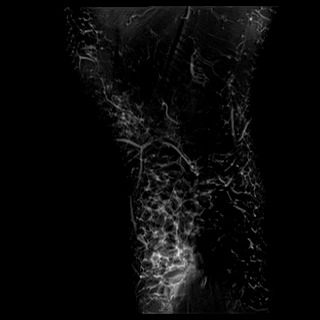

[39 of 40 positions shown; findings below may reference images not displayed]

FINDINGS: MENISCI

Medial meniscus: Longitudinal tear involving the posterior horn.
There is also an oblique coursing flap type inferior articular
surface tear in the midbody region.

Lateral meniscus: No discrete meniscal tear. There is intrasubstance
signal abnormality and what appears to be an adjacent parameniscal
cyst.

LIGAMENTS

Cruciates:  Intact

Collaterals:  Intact

CARTILAGE

Patellofemoral: Minimal degenerative chondrosis along the patellar
apex.

Medial:  Mild to moderate degenerative chondrosis.

Lateral:  Minimal degenerative chondrosis.

Joint:  Small joint effusion.

Popliteal Fossa:  No popliteal mass or Baker's cyst.

Extensor Mechanism: The patella retinacular structures are intact
and the quadriceps and patellar tendons are intact.

Bones:  No acute bony findings.

Other: Normal knee musculature.
IMPRESSION: 1. Medial meniscal tears as detailed above.
2. I do not see a discrete lateral meniscus tear but there is a
parameniscal cyst suggesting a tear.
3. Intact ligamentous structures and no acute bony findings.
4. Mild to moderate medial compartment degenerative chondrosis.
5. Small joint effusion.

## 2021-11-25 ENCOUNTER — Other Ambulatory Visit (HOSPITAL_BASED_OUTPATIENT_CLINIC_OR_DEPARTMENT_OTHER): Payer: Self-pay

## 2021-11-25 MED ORDER — INFLUENZA VAC SPLIT QUAD 0.5 ML IM SUSY
PREFILLED_SYRINGE | INTRAMUSCULAR | 0 refills | Status: AC
  Filled 2021-11-25: qty 0.5, 1d supply, fill #0

## 2022-01-02 DIAGNOSIS — H5213 Myopia, bilateral: Secondary | ICD-10-CM | POA: Diagnosis not present

## 2022-10-25 ENCOUNTER — Other Ambulatory Visit (HOSPITAL_BASED_OUTPATIENT_CLINIC_OR_DEPARTMENT_OTHER): Payer: Self-pay

## 2022-10-25 MED ORDER — INFLUENZA VIRUS VACC SPLIT PF (FLUZONE) 0.5 ML IM SUSY
0.5000 mL | PREFILLED_SYRINGE | Freq: Once | INTRAMUSCULAR | 0 refills | Status: AC
  Filled 2022-11-08: qty 0.5, 1d supply, fill #0

## 2022-11-08 ENCOUNTER — Other Ambulatory Visit (HOSPITAL_BASED_OUTPATIENT_CLINIC_OR_DEPARTMENT_OTHER): Payer: Self-pay

## 2022-11-13 ENCOUNTER — Other Ambulatory Visit (HOSPITAL_BASED_OUTPATIENT_CLINIC_OR_DEPARTMENT_OTHER): Payer: Self-pay

## 2023-01-01 DIAGNOSIS — Z1211 Encounter for screening for malignant neoplasm of colon: Secondary | ICD-10-CM | POA: Diagnosis not present

## 2023-01-01 DIAGNOSIS — Z131 Encounter for screening for diabetes mellitus: Secondary | ICD-10-CM | POA: Diagnosis not present

## 2023-01-01 DIAGNOSIS — Z13220 Encounter for screening for lipoid disorders: Secondary | ICD-10-CM | POA: Diagnosis not present

## 2023-01-01 DIAGNOSIS — R030 Elevated blood-pressure reading, without diagnosis of hypertension: Secondary | ICD-10-CM | POA: Diagnosis not present

## 2023-01-01 DIAGNOSIS — I499 Cardiac arrhythmia, unspecified: Secondary | ICD-10-CM | POA: Diagnosis not present

## 2023-01-01 DIAGNOSIS — Z0000 Encounter for general adult medical examination without abnormal findings: Secondary | ICD-10-CM | POA: Diagnosis not present

## 2023-01-01 DIAGNOSIS — Z125 Encounter for screening for malignant neoplasm of prostate: Secondary | ICD-10-CM | POA: Diagnosis not present

## 2023-01-01 DIAGNOSIS — N528 Other male erectile dysfunction: Secondary | ICD-10-CM | POA: Diagnosis not present

## 2023-02-01 ENCOUNTER — Other Ambulatory Visit (HOSPITAL_BASED_OUTPATIENT_CLINIC_OR_DEPARTMENT_OTHER): Payer: Self-pay

## 2023-02-01 DIAGNOSIS — I10 Essential (primary) hypertension: Secondary | ICD-10-CM | POA: Diagnosis not present

## 2023-02-01 DIAGNOSIS — N529 Male erectile dysfunction, unspecified: Secondary | ICD-10-CM | POA: Diagnosis not present

## 2023-02-01 MED ORDER — SILDENAFIL CITRATE 50 MG PO TABS
50.0000 mg | ORAL_TABLET | Freq: Every day | ORAL | 1 refills | Status: DC | PRN
  Filled 2023-02-01: qty 30, 30d supply, fill #0
  Filled 2023-04-07: qty 30, 30d supply, fill #1

## 2023-02-01 MED ORDER — LOSARTAN POTASSIUM 25 MG PO TABS
25.0000 mg | ORAL_TABLET | Freq: Every day | ORAL | 1 refills | Status: AC
  Filled 2023-02-01 (×2): qty 90, 90d supply, fill #0

## 2023-03-03 ENCOUNTER — Other Ambulatory Visit (HOSPITAL_BASED_OUTPATIENT_CLINIC_OR_DEPARTMENT_OTHER): Payer: Self-pay

## 2023-03-03 MED ORDER — PEG 3350-KCL-NABCB-NACL-NASULF 236 G PO SOLR
Freq: Once | ORAL | 0 refills | Status: AC
  Filled 2023-03-03: qty 4000, 1d supply, fill #0
  Filled 2023-03-03: qty 255, 1d supply, fill #0

## 2023-03-09 ENCOUNTER — Other Ambulatory Visit (HOSPITAL_BASED_OUTPATIENT_CLINIC_OR_DEPARTMENT_OTHER): Payer: Self-pay

## 2023-03-09 MED ORDER — LOSARTAN POTASSIUM 50 MG PO TABS
50.0000 mg | ORAL_TABLET | Freq: Every day | ORAL | 3 refills | Status: AC
  Filled 2023-03-09: qty 30, 30d supply, fill #0
  Filled 2023-04-07: qty 30, 30d supply, fill #1
  Filled 2023-05-27: qty 30, 30d supply, fill #2

## 2023-03-09 MED ORDER — LOSARTAN POTASSIUM-HCTZ 50-12.5 MG PO TABS
1.0000 | ORAL_TABLET | Freq: Every day | ORAL | 1 refills | Status: AC
  Filled 2023-03-09: qty 30, 30d supply, fill #0
  Filled 2023-04-07: qty 30, 30d supply, fill #1

## 2023-03-16 DIAGNOSIS — Z1211 Encounter for screening for malignant neoplasm of colon: Secondary | ICD-10-CM | POA: Diagnosis not present

## 2023-03-16 DIAGNOSIS — D122 Benign neoplasm of ascending colon: Secondary | ICD-10-CM | POA: Diagnosis not present

## 2023-04-07 ENCOUNTER — Other Ambulatory Visit (HOSPITAL_BASED_OUTPATIENT_CLINIC_OR_DEPARTMENT_OTHER): Payer: Self-pay

## 2023-05-01 ENCOUNTER — Other Ambulatory Visit: Payer: Self-pay

## 2023-05-01 ENCOUNTER — Other Ambulatory Visit (HOSPITAL_BASED_OUTPATIENT_CLINIC_OR_DEPARTMENT_OTHER): Payer: Self-pay

## 2023-05-01 DIAGNOSIS — N529 Male erectile dysfunction, unspecified: Secondary | ICD-10-CM | POA: Diagnosis not present

## 2023-05-01 DIAGNOSIS — I10 Essential (primary) hypertension: Secondary | ICD-10-CM | POA: Diagnosis not present

## 2023-05-01 MED ORDER — LOSARTAN POTASSIUM-HCTZ 100-25 MG PO TABS
1.0000 | ORAL_TABLET | Freq: Every day | ORAL | 3 refills | Status: AC
  Filled 2023-05-01: qty 90, 90d supply, fill #0
  Filled 2023-10-24: qty 90, 90d supply, fill #1

## 2023-05-01 MED ORDER — SILDENAFIL CITRATE 50 MG PO TABS
50.0000 mg | ORAL_TABLET | Freq: Every day | ORAL | 1 refills | Status: AC | PRN
  Filled 2023-05-01: qty 28, 28d supply, fill #0
  Filled 2023-05-01: qty 2, 2d supply, fill #0
  Filled 2023-06-09: qty 6, 30d supply, fill #1
  Filled 2023-08-16: qty 24, 24d supply, fill #2

## 2023-05-01 MED ORDER — AMLODIPINE BESYLATE 5 MG PO TABS
ORAL_TABLET | ORAL | 1 refills | Status: AC
  Filled 2023-05-01: qty 83, 90d supply, fill #0
  Filled 2023-10-20: qty 90, 90d supply, fill #1

## 2023-06-09 ENCOUNTER — Other Ambulatory Visit (HOSPITAL_BASED_OUTPATIENT_CLINIC_OR_DEPARTMENT_OTHER): Payer: Self-pay

## 2023-08-16 ENCOUNTER — Other Ambulatory Visit (HOSPITAL_BASED_OUTPATIENT_CLINIC_OR_DEPARTMENT_OTHER): Payer: Self-pay

## 2023-09-05 ENCOUNTER — Other Ambulatory Visit: Payer: Self-pay

## 2023-09-05 ENCOUNTER — Other Ambulatory Visit (HOSPITAL_BASED_OUTPATIENT_CLINIC_OR_DEPARTMENT_OTHER): Payer: Self-pay

## 2023-09-05 MED ORDER — SILDENAFIL CITRATE 50 MG PO TABS
50.0000 mg | ORAL_TABLET | Freq: Every day | ORAL | 1 refills | Status: DC | PRN
  Filled 2023-09-05: qty 30, 30d supply, fill #0
  Filled 2023-10-20: qty 30, 30d supply, fill #1

## 2023-10-20 ENCOUNTER — Other Ambulatory Visit (HOSPITAL_BASED_OUTPATIENT_CLINIC_OR_DEPARTMENT_OTHER): Payer: Self-pay

## 2023-10-24 ENCOUNTER — Other Ambulatory Visit (HOSPITAL_BASED_OUTPATIENT_CLINIC_OR_DEPARTMENT_OTHER): Payer: Self-pay

## 2023-11-07 ENCOUNTER — Other Ambulatory Visit (HOSPITAL_BASED_OUTPATIENT_CLINIC_OR_DEPARTMENT_OTHER): Payer: Self-pay

## 2023-11-07 MED ORDER — FLUZONE 0.5 ML IM SUSY
0.5000 mL | PREFILLED_SYRINGE | Freq: Once | INTRAMUSCULAR | 0 refills | Status: AC
  Filled 2023-11-07: qty 0.5, 1d supply, fill #0

## 2023-11-22 ENCOUNTER — Other Ambulatory Visit (HOSPITAL_BASED_OUTPATIENT_CLINIC_OR_DEPARTMENT_OTHER): Payer: Self-pay

## 2023-11-22 MED ORDER — SILDENAFIL CITRATE 50 MG PO TABS
50.0000 mg | ORAL_TABLET | Freq: Every day | ORAL | 1 refills | Status: DC | PRN
  Filled 2023-11-22: qty 30, 30d supply, fill #0
  Filled 2024-01-29: qty 30, 30d supply, fill #1

## 2024-01-29 ENCOUNTER — Other Ambulatory Visit (HOSPITAL_BASED_OUTPATIENT_CLINIC_OR_DEPARTMENT_OTHER): Payer: Self-pay

## 2024-02-11 ENCOUNTER — Other Ambulatory Visit (HOSPITAL_BASED_OUTPATIENT_CLINIC_OR_DEPARTMENT_OTHER): Payer: Self-pay

## 2024-02-11 MED ORDER — SILDENAFIL CITRATE 50 MG PO TABS
50.0000 mg | ORAL_TABLET | Freq: Every day | ORAL | 1 refills | Status: AC | PRN
  Filled 2024-02-11: qty 6, 30d supply, fill #0
  Filled 2024-03-01: qty 30, 30d supply, fill #0

## 2024-02-11 MED ORDER — LOSARTAN POTASSIUM-HCTZ 100-25 MG PO TABS
1.0000 | ORAL_TABLET | Freq: Every day | ORAL | 3 refills | Status: AC
  Filled 2024-02-11 – 2024-03-01 (×2): qty 90, 90d supply, fill #0

## 2024-02-25 ENCOUNTER — Other Ambulatory Visit (HOSPITAL_BASED_OUTPATIENT_CLINIC_OR_DEPARTMENT_OTHER): Payer: Self-pay

## 2024-03-01 ENCOUNTER — Other Ambulatory Visit (HOSPITAL_BASED_OUTPATIENT_CLINIC_OR_DEPARTMENT_OTHER): Payer: Self-pay
